# Patient Record
Sex: Female | Born: 2000 | Hispanic: No | Marital: Single | Smoking: Never smoker
Health system: Southern US, Community
[De-identification: ages and names within clinical notes are randomized; demographics above are authoritative.]

## PROBLEM LIST (undated history)

## (undated) DIAGNOSIS — Z9109 Other allergy status, other than to drugs and biological substances: Secondary | ICD-10-CM

## (undated) DIAGNOSIS — J302 Other seasonal allergic rhinitis: Secondary | ICD-10-CM

## (undated) DIAGNOSIS — J069 Acute upper respiratory infection, unspecified: Secondary | ICD-10-CM

## (undated) DIAGNOSIS — J3081 Allergic rhinitis due to animal (cat) (dog) hair and dander: Secondary | ICD-10-CM

## (undated) HISTORY — DX: Acute upper respiratory infection, unspecified: J06.9

---
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
## 2023-06-24 ENCOUNTER — Emergency Department (HOSPITAL_COMMUNITY): Admission: EM | Admit: 2023-06-24 | Discharge: 2023-06-25 | Discharge status: Against Medical Advice

## 2024-03-28 ENCOUNTER — Other Ambulatory Visit: Payer: Self-pay

## 2024-03-28 ENCOUNTER — Encounter (HOSPITAL_COMMUNITY): Payer: Self-pay

## 2024-03-28 ENCOUNTER — Emergency Department (HOSPITAL_COMMUNITY): Admission: EM | Admit: 2024-03-28 | Discharge: 2024-03-28 | Disposition: A | Discharge status: Home / Self Care

## 2024-03-28 DIAGNOSIS — N3001 Acute cystitis with hematuria: Secondary | ICD-10-CM | POA: Insufficient documentation

## 2024-03-28 DIAGNOSIS — R300 Dysuria: Secondary | ICD-10-CM | POA: Diagnosis present

## 2024-03-28 LAB — URINALYSIS, ROUTINE W REFLEX MICROSCOPIC
Bilirubin Urine: NEGATIVE
Glucose, UA: NEGATIVE mg/dL
Ketones, ur: NEGATIVE mg/dL
Nitrite: POSITIVE — AB
Protein, ur: NEGATIVE mg/dL
Specific Gravity, Urine: 1.001 — ABNORMAL LOW (ref 1.005–1.030)
pH: 7 (ref 5.0–8.0)

## 2024-03-28 MED ORDER — CEPHALEXIN 500 MG PO CAPS
500.0000 mg | ORAL_CAPSULE | Freq: Once | ORAL | Status: AC
  Administered 2024-03-28: 500 mg via ORAL
  Filled 2024-03-28: qty 1

## 2024-03-28 MED ORDER — CEPHALEXIN 500 MG PO CAPS: 500.0000 mg | ORAL_CAPSULE | Freq: Two times a day (BID) | ORAL | 0 refills | Status: AC

## 2024-03-28 NOTE — ED Triage Notes (Signed)
 Pt reports with lower abdominal pain and burning with urination since Saturday. Pt has been taking AZO with no relief.

## 2024-03-28 NOTE — ED Provider Notes (Signed)
 " [facility] AT [facility] Provider Note   CSN: [id number] Arrival date & time: 03/28/24  1845     Patient presents with: Dysuria   Alejandra Simpson is a 24 y.o. female.  Presents today for lower abdominal pain, dysuria, urgency, and frequency since Saturday.  Patient has been taking over-the-counter Azo without relief.  Patient denies fever, chills, nausea, vomiting, flank pain, hematuria, or vaginal discharge.    Dysuria      Prior to Admission medications  Medication Sig Start Date End Date Taking? Authorizing Provider  cephALEXin  (KEFLEX ) 500 MG capsule Take 1 capsule (500 mg total) by mouth 2 (two) times daily. 03/28/24  Yes Analilia Geddis N, PA-C  azelastine  (ASTELIN ) 0.1 % nasal spray Place 2 sprays into both nostrils 2 (two) times daily. Patient not taking: Reported on 05/29/2019 10/07/17   Iva Marty Saltness, MD  cyclobenzaprine  (FLEXERIL ) 10 MG tablet Take 1 tablet (10 mg total) by mouth 2 (two) times daily as needed for muscle spasms. 05/29/19   Randol Simmonds, MD  levocetirizine (XYZAL ) 5 MG tablet Take 1 tablet (5 mg total) by mouth every evening. Patient not taking: Reported on 05/29/2019 10/07/17   Iva Marty Saltness, MD  loratadine  (CLARITIN ) 10 MG tablet Take 10 mg by mouth daily as needed for allergies.     [provider]  montelukast  (SINGULAIR ) 10 MG tablet Take 1 tablet (10 mg total) by mouth at bedtime. Patient not taking: Reported on 05/29/2019 10/07/17   Iva Marty Saltness, MD  naproxen  (NAPROSYN ) 500 MG tablet Take 1 tablet (500 mg total) by mouth 2 (two) times daily with a meal. As needed for pain 05/29/19   Randol Simmonds, MD    Allergies: Pollen extract    Review of Systems  Genitourinary:  Positive for dysuria, frequency and urgency.    Updated Vital Signs BP (!) 150/90   Pulse 66   Temp 98.6 F (37 C) (Oral)   Resp 20   SpO2 98%   Physical Exam Vitals and nursing note reviewed.  Constitutional:       General: She is not in acute distress.    Appearance: She is well-developed. She is not toxic-appearing.  HENT:     Head: Normocephalic and atraumatic.  Eyes:     Conjunctiva/sclera: Conjunctivae normal.  Cardiovascular:     Rate and Rhythm: Normal rate and regular rhythm.     Pulses: Normal pulses.     Heart sounds: No murmur heard. Pulmonary:     Effort: Pulmonary effort is normal. No respiratory distress.     Breath sounds: Normal breath sounds.  Abdominal:     General: There is no distension.     Palpations: Abdomen is soft.     Tenderness: There is no abdominal tenderness. There is no right CVA tenderness or left CVA tenderness.  Musculoskeletal:        General: No swelling.     Cervical back: Neck supple.  Skin:    General: Skin is warm and dry.     Capillary Refill: Capillary refill takes less than 2 seconds.  Neurological:     General: No focal deficit present.     Mental Status: She is alert and oriented to person, place, and time.  Psychiatric:        Mood and Affect: Mood normal.     (all labs ordered are listed, but only abnormal results are displayed) Labs Reviewed  URINALYSIS, ROUTINE W REFLEX MICROSCOPIC - Abnormal; Notable for  the following components:      Result Value   Color, Urine AMBER (*)    Specific Gravity, Urine 1.001 (*)    Hgb urine dipstick MODERATE (*)    Nitrite POSITIVE (*)    Leukocytes,Ua LARGE (*)    Bacteria, UA RARE (*)    All other components within normal limits    EKG: None  Radiology: No results found.   Procedures   Medications Ordered in the ED  cephALEXin  (KEFLEX ) capsule 500 mg (has no administration in time range)                                    Medical Decision Making Amount and/or Complexity of Data Reviewed Labs: ordered.   This patient presents to the ED for concern of dysuria differential diagnosis includes gonorrhea, chlamydia, UTI, pyelonephritis, kidney stone  Lab Tests:  I Ordered, and  personally interpreted labs.  The pertinent results include: UA with moderate hemoglobin, large leukocytes, positive nitrites, low specific gravity, rare bacteria, 11-20 WBCs   Medicines ordered and prescription drug management:  I ordered medication including Keflex     I have reviewed the patients home medicines and have made adjustments as needed   Problem List / ED Course:  Considered for admission further workup however patient's vital signs, physical exam, and labs are reassuring.  Patient's symptoms likely due to urinary tract infection.  Patient given outpatient treatment with Keflex .  Patient given return precautions.  I feel patient safer discharge at this time.     Final diagnoses:  Acute cystitis with hematuria    ED Discharge Orders          Ordered    cephALEXin  (KEFLEX ) 500 MG capsule  2 times daily        03/28/24 2049               Francis Ileana SAILOR, PA-C 03/28/24 2050    Neysa Caron PARAS, DO 03/28/24 2226  "

## 2024-03-28 NOTE — Discharge Instructions (Signed)
 Today you were seen for a urinary tract infection.  Please pick up your Keflex  and take as prescribed.  Please return to the ED if you have uncontrollable vomiting, fever that does not go down with Tylenol or Motrin , or worsening symptoms.  Thank you for letting us  treat you today. After reviewing your labs, I feel you are safe to go home. Please follow up with your PCP in the next several days and provide them with your records from this visit. Return to the Emergency Room if pain becomes severe or symptoms worsen.
